# Patient Record
Sex: Female | Born: 1945 | Hispanic: No | Marital: Married | Smoking: Never smoker
Health system: Southern US, Community
[De-identification: ages and names within clinical notes are randomized; demographics above are authoritative.]

## PROBLEM LIST (undated history)

## (undated) DIAGNOSIS — I10 Essential (primary) hypertension: Secondary | ICD-10-CM

---

## 1998-05-31 ENCOUNTER — Other Ambulatory Visit: Admission: RE | Admit: 1998-05-31 | Discharge: 1998-05-31 | Payer: Self-pay | Admitting: Family Medicine

## 2003-01-09 ENCOUNTER — Other Ambulatory Visit: Admission: RE | Admit: 2003-01-09 | Discharge: 2003-01-09 | Payer: Self-pay | Admitting: Obstetrics and Gynecology

## 2004-04-03 ENCOUNTER — Other Ambulatory Visit: Admission: RE | Admit: 2004-04-03 | Discharge: 2004-04-03 | Payer: Self-pay | Admitting: Obstetrics and Gynecology

## 2014-01-01 ENCOUNTER — Encounter: Payer: Self-pay | Admitting: Gastroenterology

## 2018-08-03 ENCOUNTER — Encounter (HOSPITAL_COMMUNITY): Payer: Self-pay | Admitting: Emergency Medicine

## 2018-08-03 ENCOUNTER — Emergency Department (HOSPITAL_COMMUNITY): Payer: PRIVATE HEALTH INSURANCE

## 2018-08-03 ENCOUNTER — Emergency Department (HOSPITAL_COMMUNITY)
Admission: EM | Admit: 2018-08-03 | Discharge: 2018-08-03 | Disposition: A | Discharge status: Home / Self Care | Payer: PRIVATE HEALTH INSURANCE | Attending: Emergency Medicine | Admitting: Emergency Medicine

## 2018-08-03 ENCOUNTER — Other Ambulatory Visit: Payer: Self-pay

## 2018-08-03 DIAGNOSIS — I10 Essential (primary) hypertension: Secondary | ICD-10-CM | POA: Insufficient documentation

## 2018-08-03 DIAGNOSIS — W01198A Fall on same level from slipping, tripping and stumbling with subsequent striking against other object, initial encounter: Secondary | ICD-10-CM | POA: Insufficient documentation

## 2018-08-03 DIAGNOSIS — Y998 Other external cause status: Secondary | ICD-10-CM | POA: Insufficient documentation

## 2018-08-03 DIAGNOSIS — S40812A Abrasion of left upper arm, initial encounter: Secondary | ICD-10-CM | POA: Insufficient documentation

## 2018-08-03 DIAGNOSIS — Z23 Encounter for immunization: Secondary | ICD-10-CM | POA: Diagnosis not present

## 2018-08-03 DIAGNOSIS — Y9301 Activity, walking, marching and hiking: Secondary | ICD-10-CM | POA: Diagnosis not present

## 2018-08-03 DIAGNOSIS — S42201A Unspecified fracture of upper end of right humerus, initial encounter for closed fracture: Secondary | ICD-10-CM | POA: Insufficient documentation

## 2018-08-03 DIAGNOSIS — Y929 Unspecified place or not applicable: Secondary | ICD-10-CM | POA: Diagnosis not present

## 2018-08-03 DIAGNOSIS — S0083XA Contusion of other part of head, initial encounter: Secondary | ICD-10-CM | POA: Insufficient documentation

## 2018-08-03 DIAGNOSIS — S4991XA Unspecified injury of right shoulder and upper arm, initial encounter: Secondary | ICD-10-CM | POA: Diagnosis present

## 2018-08-03 DIAGNOSIS — W010XXA Fall on same level from slipping, tripping and stumbling without subsequent striking against object, initial encounter: Secondary | ICD-10-CM

## 2018-08-03 HISTORY — DX: Essential (primary) hypertension: I10

## 2018-08-03 MED ORDER — TRAMADOL HCL 50 MG PO TABS: 50.0000 mg | ORAL_TABLET | Freq: Four times a day (QID) | ORAL | 0 refills | Status: AC | PRN

## 2018-08-03 MED ORDER — ACETAMINOPHEN 500 MG PO TABS
1000.0000 mg | ORAL_TABLET | Freq: Once | ORAL | Status: DC
  Filled 2018-08-03: qty 2

## 2018-08-03 MED ORDER — TETANUS-DIPHTH-ACELL PERTUSSIS 5-2.5-18.5 LF-MCG/0.5 IM SUSP
0.5000 mL | Freq: Once | INTRAMUSCULAR | Status: AC
  Administered 2018-08-03: 0.5 mL via INTRAMUSCULAR
  Filled 2018-08-03: qty 0.5

## 2018-08-03 NOTE — ED Provider Notes (Signed)
[facility] EMERGENCY DEPARTMENT Provider Note   CSN: [id number] Arrival date & time: 08/03/18  2030     History   Chief Complaint Chief Complaint  Patient presents with  . Fall  . Shoulder Pain    HPI Amber Robles is a 73 y.o. female.  Patient c/o trip and fall today around 1700. States tripped on cardboard, fell forward. Hit head, no loc. C/o right shoulder pain. Pain constant, dull, moderate, non radiating, worse w movement. No arm numbness/weakness. +neck pain, no radicular pain. No back pain. Denies chest pain or sob. No abd pain. No nv. Abrasion to LUE, tetanus unknown. Ambulatory since fall. Denies other pain or injury.   The history is provided by the patient and a relative.  Fall  Pertinent negatives include no chest pain, no abdominal pain, no headaches and no shortness of breath.  Shoulder Pain  Associated symptoms: no back pain and no fever     Past Medical History:  Diagnosis Date  . Hypertension     There are no active problems to display for this patient.   History reviewed. No pertinent surgical history.   OB History   No obstetric history on file.      Home Medications    Prior to Admission medications   Not on File    Family History No family history on file.  Social History Social History   Tobacco Use  . Smoking status: Never Smoker  . Smokeless tobacco: Never Used  Substance Use Topics  . Alcohol use: Yes  . Drug use: Never     Allergies   Sulfa antibiotics   Review of Systems Review of Systems  Constitutional: Negative for fever.  HENT: Negative for nosebleeds.   Eyes: Negative for redness.  Respiratory: Negative for shortness of breath.   Cardiovascular: Negative for chest pain.  Gastrointestinal: Negative for abdominal pain and vomiting.  Genitourinary: Negative for flank pain.  Musculoskeletal: Negative for back pain.  Skin: Negative for rash.  Neurological: Negative for weakness, numbness and  headaches.  Hematological: Does not bruise/bleed easily.       No anticoag use.   Psychiatric/Behavioral: Negative for confusion.     Physical Exam Updated Vital Signs BP 125/81 (BP Location: Right Arm)   Pulse 88   Temp 98.5 F (36.9 C) (Oral)   Resp 18   SpO2 98%   Physical Exam Vitals signs and nursing note reviewed.  Constitutional:      Appearance: Normal appearance. She is well-developed.  HENT:     Head:     Comments: Minor contusion right forehead.     Nose: Nose normal.     Mouth/Throat:     Mouth: Mucous membranes are moist.  Eyes:     General: No scleral icterus.    Conjunctiva/sclera: Conjunctivae normal.     Pupils: Pupils are equal, round, and reactive to light.  Neck:     Musculoskeletal: Normal range of motion and neck supple. Muscular tenderness present. No neck rigidity.     Trachea: No tracheal deviation.  Cardiovascular:     Rate and Rhythm: Normal rate and regular rhythm.     Pulses: Normal pulses.     Heart sounds: Normal heart sounds. No murmur. No friction rub. No gallop.   Pulmonary:     Effort: Pulmonary effort is normal. No respiratory distress.     Breath sounds: Normal breath sounds.  Abdominal:     General: Bowel sounds are normal. There is no  distension.     Palpations: Abdomen is soft.     Tenderness: There is no abdominal tenderness. There is no guarding.  Genitourinary:    Comments: No cva tenderness.  Musculoskeletal:     Comments: Tenderness right shoulder. Compartments of arm soft, not tense. Radial pulse 2+. Mid to lower c spine tenderness, otherwise, CTLS spine, non tender, aligned, no step off. Good rom bil extremities, no other focal bony tenderness noted.   Skin:    General: Skin is warm and dry.     Findings: No rash.  Neurological:     Mental Status: She is alert.     Comments: Alert, speech normal. Motor/sens grossly intact bil. RUE, rad/med/uln n fxn intact, motor and sens.   Psychiatric:        Mood and Affect:  Mood normal.      ED Treatments / Results  Labs (all labs ordered are listed, but only abnormal results are displayed) Labs Reviewed - No data to display  EKG None  Radiology Dg Shoulder Right  Result Date: 08/03/2018 CLINICAL DATA:  Right shoulder pain after fall. EXAM: RIGHT SHOULDER - 2+ VIEW COMPARISON:  None. FINDINGS: Moderately displaced and comminuted fracture is seen involving the proximal right humeral head and neck. Visualized ribs are unremarkable. Scapula is unremarkable. IMPRESSION: Moderately displaced and comminuted proximal right humeral head and neck fracture. Electronically Signed   By: Lupita RaiderJames  Green Jr, M.D.   On: 08/03/2018 21:13   Ct Cervical Spine Wo Contrast  Result Date: 08/03/2018 CLINICAL DATA:  Fall with right shoulder pain and forehead bruising. EXAM: CT CERVICAL SPINE WITHOUT CONTRAST TECHNIQUE: Multidetector CT imaging of the cervical spine was performed without intravenous contrast. Multiplanar CT image reconstructions were also generated. COMPARISON:  None. FINDINGS: Alignment: No static subluxation. Facets are aligned. Occipital condyles and the lateral masses of C1 and C2 are normally approximated. Skull base and vertebrae: No acute fracture. Soft tissues and spinal canal: No prevertebral fluid or swelling. No visible canal hematoma. Disc levels: No advanced spinal canal or neural foraminal stenosis. Left C3-4 facets are fused. Mild multilevel lower cervical degenerative disc disease. Upper chest: No pneumothorax, pulmonary nodule or pleural effusion. Other: Normal visualized paraspinal cervical soft tissues. IMPRESSION: No acute fracture or static subluxation of the cervical spine. Electronically Signed   By: Deatra RobinsonKevin  Herman M.D.   On: 08/03/2018 22:58    Procedures Procedures (including critical care time)  Medications Ordered in ED Medications  acetaminophen (TYLENOL) tablet 1,000 mg (has no administration in time range)  Tdap (BOOSTRIX) injection 0.5  mL (has no administration in time range)     Initial Impression / Assessment and Plan / ED Course  I have reviewed the triage vital signs and the nursing notes.  Pertinent labs & imaging results that were available during my care of the patient were reviewed by me and considered in my medical decision making (see chart for details).  Imaging ordered.  xrays reviewed - comminuted prox hum fx. Shoulder imm placed. Icepack.   Pt indicates already took ibuprofen. Pt indicates does not want narcotic here for pain, will accept mild pain med. Acetaminophen po.   Reviewed nursing notes and prior charts for additional history.   Ct reviewed - neg acute.  Pt appears stable for d/c.  Ortho f/u.   Return precautions provided.    Final Clinical Impressions(s) / ED Diagnoses   Final diagnoses:  None    ED Discharge Orders    None  Cathren Laine, MD 08/03/18 [phone]

## 2018-08-03 NOTE — ED Triage Notes (Signed)
Pt sent by urgent care in [place], had a mechanical fall at 1700 today. C/o right shoulder pain and bruising to forehead. Denies LOC, no blood thinner use. Pt A&O x 4, ambulatory without assistance. Pt states she doesn't think she needs a CT scan of her head and will wait until she sees a provider.

## 2018-08-03 NOTE — ED Notes (Signed)
Pt from urgent care w/ a broken "arm".

## 2018-08-03 NOTE — Discharge Instructions (Addendum)
It was our pleasure to provide your ER care today - we hope that you feel better.  Take acetaminophen and/or ibuprofen as need for pain. You may also take ultram as need for pain - no driving when taking.  Follow up with orthopedist in the coming week - call office tomorrow to arrange appointment.   Wear shoulder immobilizer/sling, icepack to sore area.   Return to ER if worse, new symptoms, new or severe pain, severe headache, other concern.

## 2018-08-11 DIAGNOSIS — Z01818 Encounter for other preprocedural examination: Secondary | ICD-10-CM | POA: Diagnosis not present

## 2019-02-23 DIAGNOSIS — S42213A Unspecified displaced fracture of surgical neck of unspecified humerus, initial encounter for closed fracture: Secondary | ICD-10-CM | POA: Diagnosis not present

## 2019-02-23 DIAGNOSIS — S42211D Unspecified displaced fracture of surgical neck of right humerus, subsequent encounter for fracture with routine healing: Secondary | ICD-10-CM | POA: Diagnosis not present

## 2019-02-23 DIAGNOSIS — Z9889 Other specified postprocedural states: Secondary | ICD-10-CM | POA: Diagnosis not present

## 2019-03-08 ENCOUNTER — Encounter: Payer: Self-pay | Admitting: Gastroenterology

## 2019-06-14 ENCOUNTER — Other Ambulatory Visit: Payer: Self-pay

## 2020-02-13 ENCOUNTER — Other Ambulatory Visit: Payer: Self-pay

## 2020-11-06 IMAGING — CT CT CERVICAL SPINE W/O CM
3 of 4 series · 13 of 33 positions shown, 16 images · non-contrast
Comparison: None.

CLINICAL DATA: Fall with right shoulder pain and forehead bruising.

EXAM:
CT CERVICAL SPINE WITHOUT CONTRAST
TECHNIQUE: Multidetector CT imaging of the cervical spine was performed without
intravenous contrast. Multiplanar CT image reconstructions were also
generated.

[Series 5: c_spine 2.0 st · axial · 0.35mm/px · z∈[-303,-187]mm · 5 of 88 slices shown, 7 images]
[im 15/88  soft-tissue]
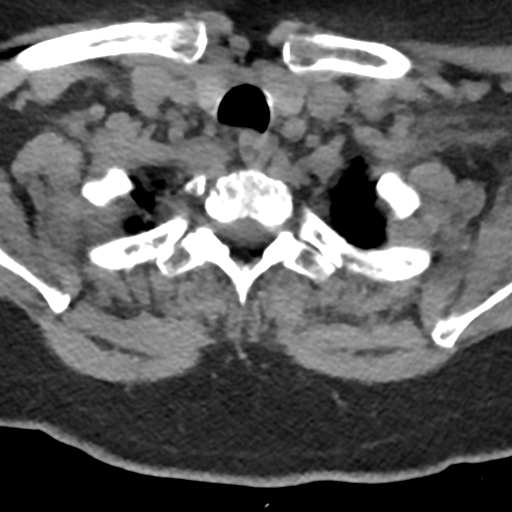
[im 15/88  bone]
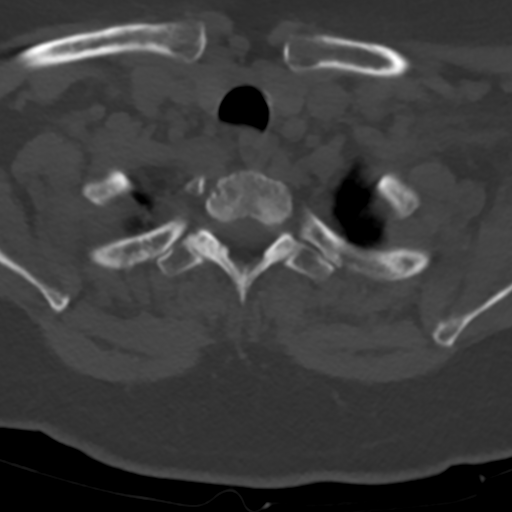
[im 30/88  bone]
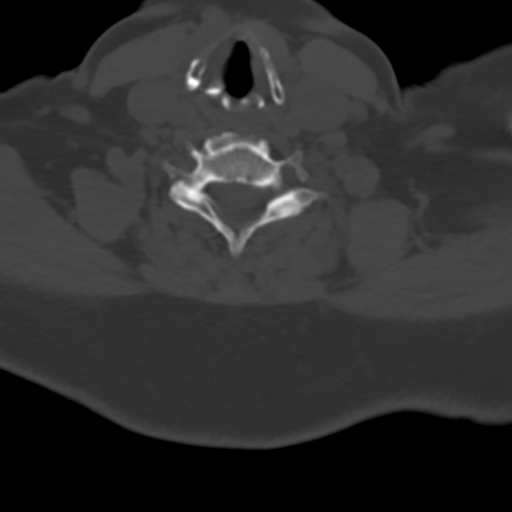
[im 44/88  bone]
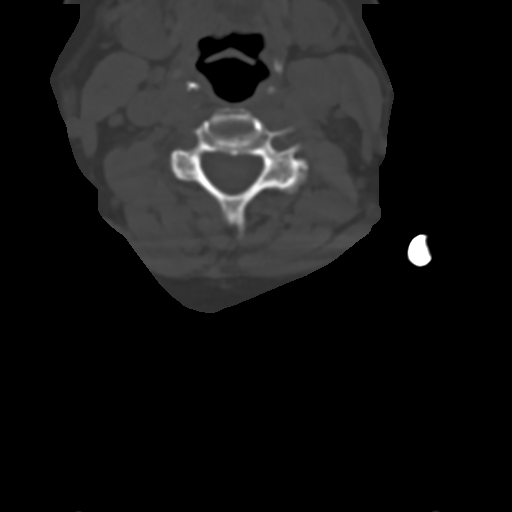
[im 59/88  bone]
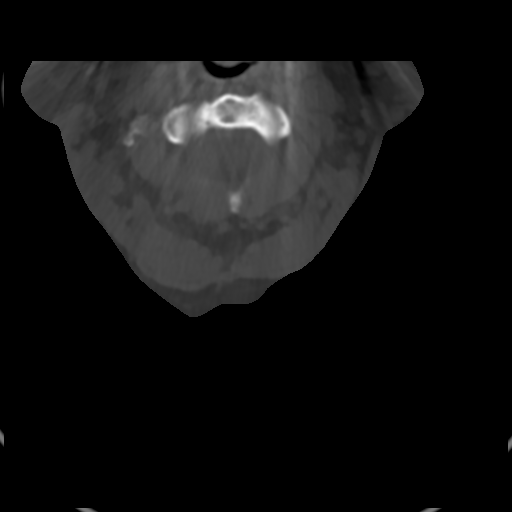
[im 73/88  soft-tissue]
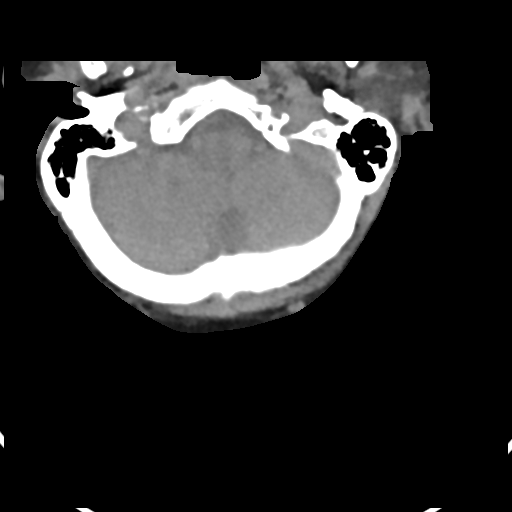
[im 73/88  bone]
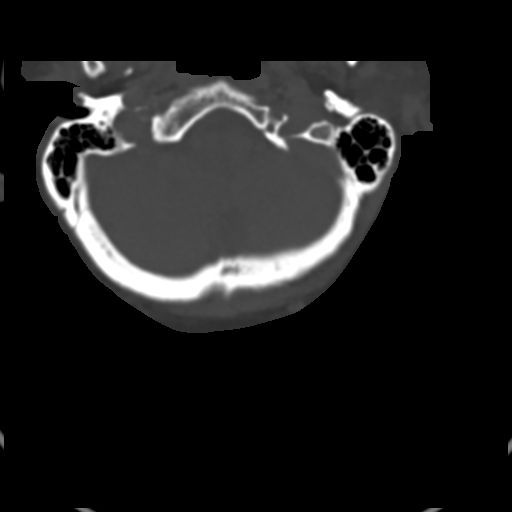

[Series 6: coronal bone · coronal · 0.26mm/px · 3 of 78 slices shown]
[im 16/78  bone]
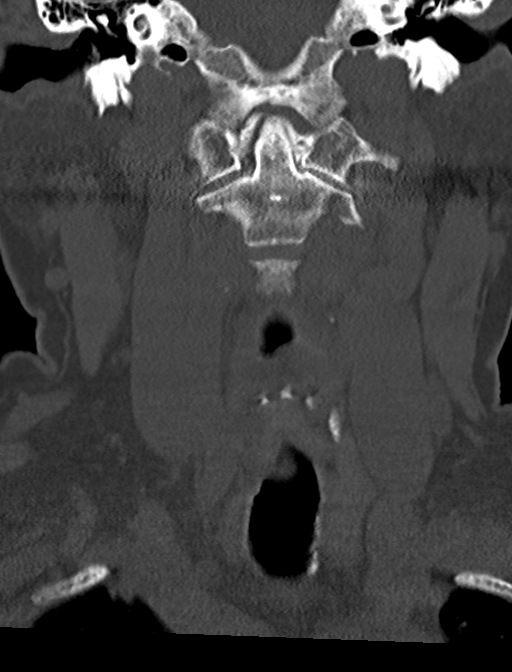
[im 31/78  bone]
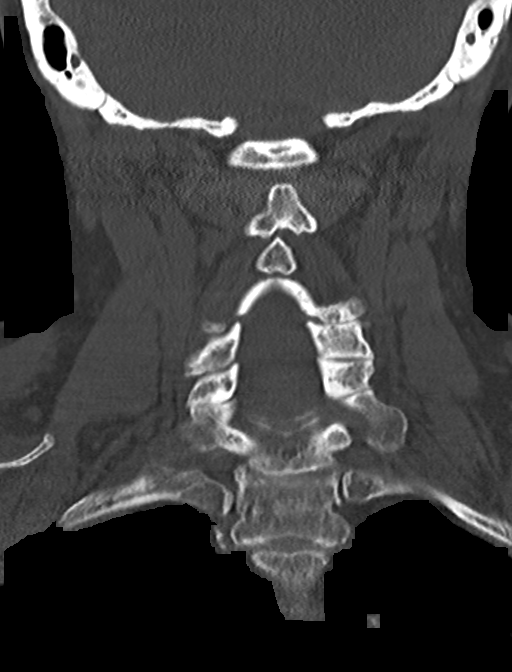
[im 47/78  bone]
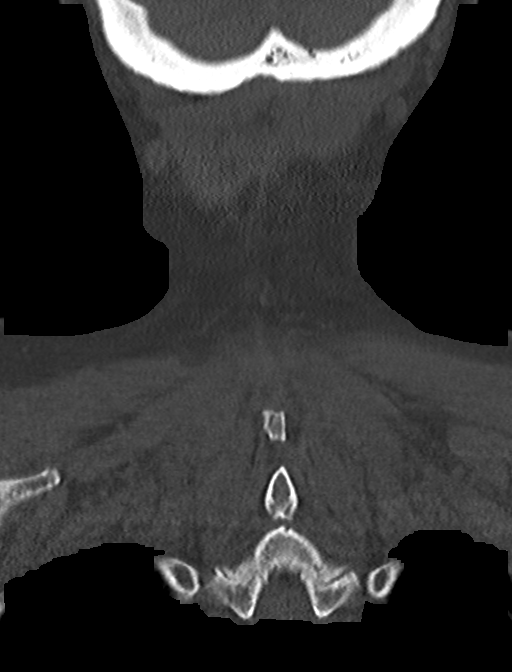

[Series 7: sagittal bone · sagittal · 0.31mm/px · 5 of 79 slices shown, 6 images]
[im 27/79  bone]
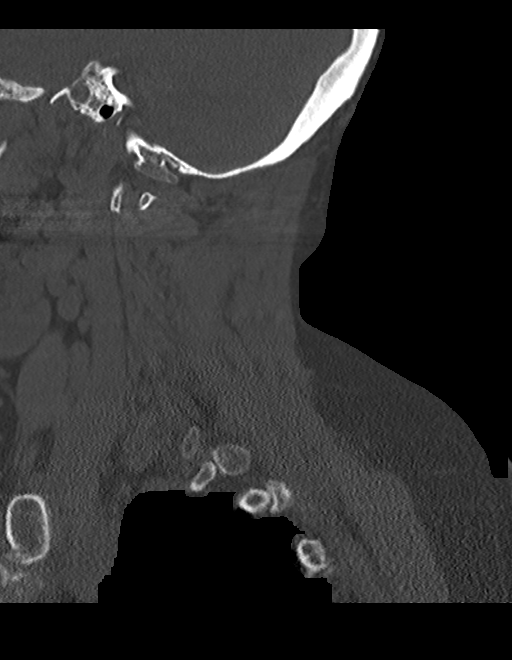
[im 33/79  bone]
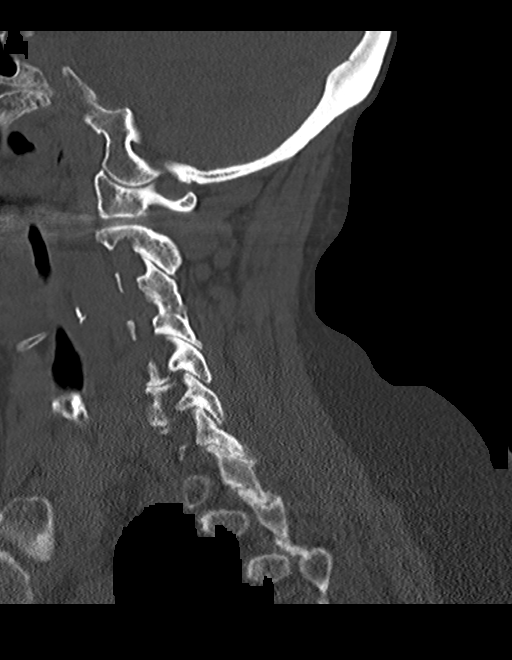
[im 40/79  soft-tissue]
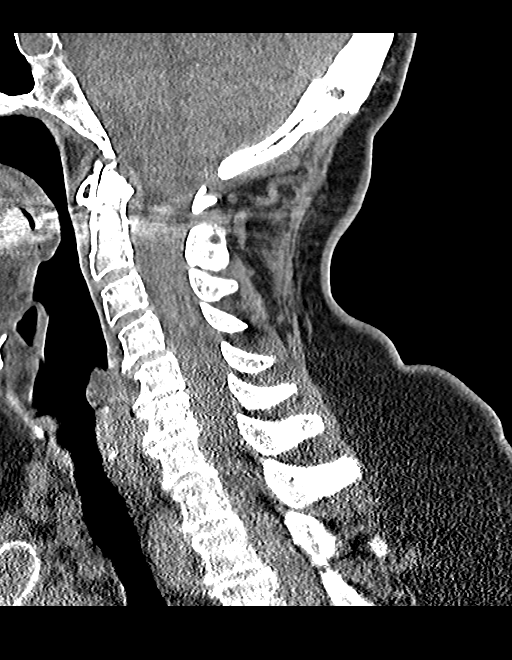
[im 40/79  bone]
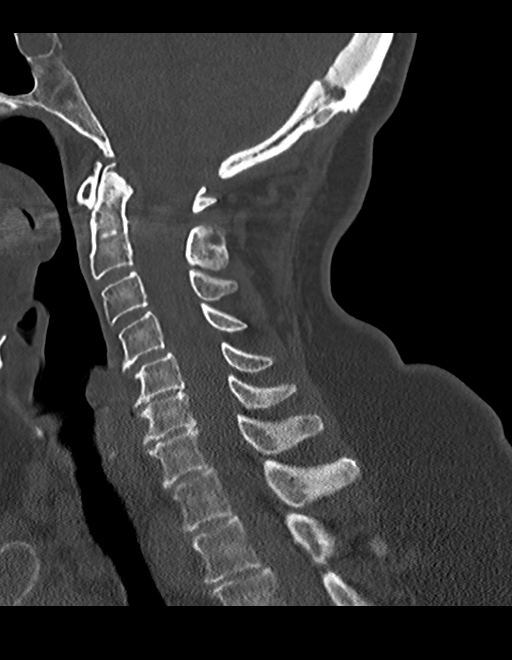
[im 46/79  bone]
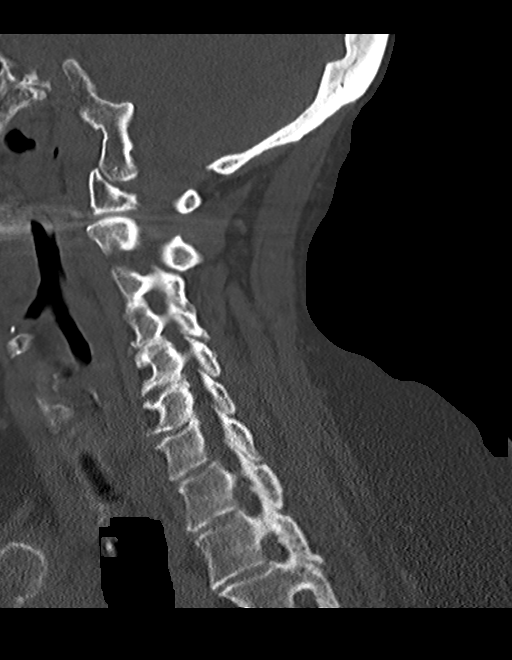
[im 53/79  bone]
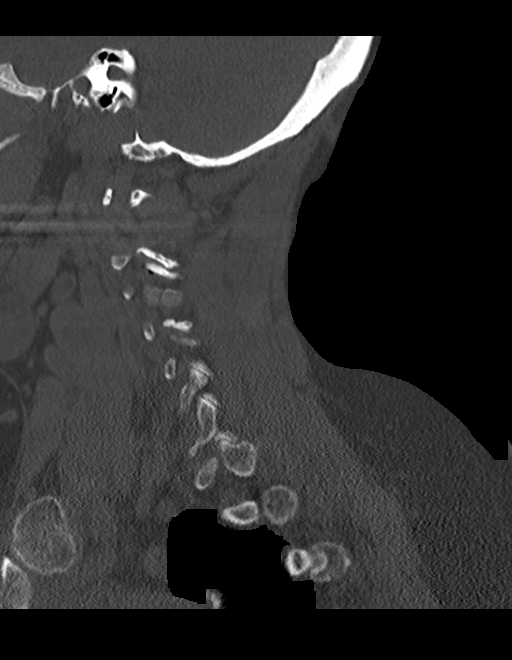

[13 of 33 positions shown; findings below may reference images not displayed]

FINDINGS: Alignment: No static subluxation. Facets are aligned. Occipital
condyles and the lateral masses of C1 and C2 are normally
approximated.

Skull base and vertebrae: No acute fracture.

Soft tissues and spinal canal: No prevertebral fluid or swelling. No
visible canal hematoma.

Disc levels: No advanced spinal canal or neural foraminal stenosis.
Left C3-4 facets are fused. Mild multilevel lower cervical
degenerative disc disease.

Upper chest: No pneumothorax, pulmonary nodule or pleural effusion.

Other: Normal visualized paraspinal cervical soft tissues.
IMPRESSION: No acute fracture or static subluxation of the cervical spine.

## 2021-08-11 DIAGNOSIS — E559 Vitamin D deficiency, unspecified: Secondary | ICD-10-CM | POA: Diagnosis not present

## 2021-08-11 DIAGNOSIS — H409 Unspecified glaucoma: Secondary | ICD-10-CM | POA: Diagnosis not present

## 2021-08-11 DIAGNOSIS — M1990 Unspecified osteoarthritis, unspecified site: Secondary | ICD-10-CM | POA: Diagnosis not present

## 2021-08-11 DIAGNOSIS — G620 Drug-induced polyneuropathy: Secondary | ICD-10-CM | POA: Diagnosis not present

## 2021-08-11 DIAGNOSIS — G8929 Other chronic pain: Secondary | ICD-10-CM | POA: Diagnosis not present

## 2021-08-11 DIAGNOSIS — L409 Psoriasis, unspecified: Secondary | ICD-10-CM | POA: Diagnosis not present

## 2021-08-11 DIAGNOSIS — E669 Obesity, unspecified: Secondary | ICD-10-CM | POA: Diagnosis not present

## 2021-08-11 DIAGNOSIS — H259 Unspecified age-related cataract: Secondary | ICD-10-CM | POA: Diagnosis not present

## 2021-08-11 DIAGNOSIS — R32 Unspecified urinary incontinence: Secondary | ICD-10-CM | POA: Diagnosis not present

## 2021-08-11 DIAGNOSIS — G4733 Obstructive sleep apnea (adult) (pediatric): Secondary | ICD-10-CM | POA: Diagnosis not present

## 2021-08-11 DIAGNOSIS — R011 Cardiac murmur, unspecified: Secondary | ICD-10-CM | POA: Diagnosis not present

## 2021-08-11 DIAGNOSIS — M8580 Other specified disorders of bone density and structure, unspecified site: Secondary | ICD-10-CM | POA: Diagnosis not present

## 2021-09-17 DIAGNOSIS — Z1231 Encounter for screening mammogram for malignant neoplasm of breast: Secondary | ICD-10-CM | POA: Diagnosis not present

## 2021-09-26 DIAGNOSIS — R922 Inconclusive mammogram: Secondary | ICD-10-CM | POA: Diagnosis not present

## 2021-09-26 DIAGNOSIS — R921 Mammographic calcification found on diagnostic imaging of breast: Secondary | ICD-10-CM | POA: Diagnosis not present

## 2021-09-26 DIAGNOSIS — R928 Other abnormal and inconclusive findings on diagnostic imaging of breast: Secondary | ICD-10-CM | POA: Diagnosis not present

## 2021-09-30 DIAGNOSIS — R921 Mammographic calcification found on diagnostic imaging of breast: Secondary | ICD-10-CM | POA: Diagnosis not present

## 2021-09-30 DIAGNOSIS — N6011 Diffuse cystic mastopathy of right breast: Secondary | ICD-10-CM | POA: Diagnosis not present

## 2021-11-03 DIAGNOSIS — H35342 Macular cyst, hole, or pseudohole, left eye: Secondary | ICD-10-CM | POA: Diagnosis not present

## 2021-11-03 DIAGNOSIS — H35352 Cystoid macular degeneration, left eye: Secondary | ICD-10-CM | POA: Diagnosis not present

## 2021-11-03 DIAGNOSIS — H52223 Regular astigmatism, bilateral: Secondary | ICD-10-CM | POA: Diagnosis not present

## 2021-11-03 DIAGNOSIS — H18513 Endothelial corneal dystrophy, bilateral: Secondary | ICD-10-CM | POA: Diagnosis not present

## 2021-11-03 DIAGNOSIS — H401132 Primary open-angle glaucoma, bilateral, moderate stage: Secondary | ICD-10-CM | POA: Diagnosis not present

## 2021-11-03 DIAGNOSIS — H5203 Hypermetropia, bilateral: Secondary | ICD-10-CM | POA: Diagnosis not present

## 2021-11-03 DIAGNOSIS — H524 Presbyopia: Secondary | ICD-10-CM | POA: Diagnosis not present

## 2021-11-03 DIAGNOSIS — H25813 Combined forms of age-related cataract, bilateral: Secondary | ICD-10-CM | POA: Diagnosis not present

## 2021-11-03 DIAGNOSIS — H47233 Glaucomatous optic atrophy, bilateral: Secondary | ICD-10-CM | POA: Diagnosis not present

## 2022-11-05 DIAGNOSIS — H5203 Hypermetropia, bilateral: Secondary | ICD-10-CM | POA: Diagnosis not present

## 2022-11-05 DIAGNOSIS — H52223 Regular astigmatism, bilateral: Secondary | ICD-10-CM | POA: Diagnosis not present

## 2022-11-05 DIAGNOSIS — H25813 Combined forms of age-related cataract, bilateral: Secondary | ICD-10-CM | POA: Diagnosis not present

## 2022-11-05 DIAGNOSIS — H35352 Cystoid macular degeneration, left eye: Secondary | ICD-10-CM | POA: Diagnosis not present

## 2022-11-05 DIAGNOSIS — H47233 Glaucomatous optic atrophy, bilateral: Secondary | ICD-10-CM | POA: Diagnosis not present

## 2022-11-05 DIAGNOSIS — H524 Presbyopia: Secondary | ICD-10-CM | POA: Diagnosis not present

## 2022-11-05 DIAGNOSIS — H35342 Macular cyst, hole, or pseudohole, left eye: Secondary | ICD-10-CM | POA: Diagnosis not present

## 2022-11-05 DIAGNOSIS — H18513 Endothelial corneal dystrophy, bilateral: Secondary | ICD-10-CM | POA: Diagnosis not present

## 2022-11-05 DIAGNOSIS — H401132 Primary open-angle glaucoma, bilateral, moderate stage: Secondary | ICD-10-CM | POA: Diagnosis not present

## 2023-03-25 DIAGNOSIS — R509 Fever, unspecified: Secondary | ICD-10-CM | POA: Diagnosis not present

## 2023-03-25 DIAGNOSIS — R5382 Chronic fatigue, unspecified: Secondary | ICD-10-CM | POA: Diagnosis not present

## 2024-05-22 DIAGNOSIS — H524 Presbyopia: Secondary | ICD-10-CM | POA: Diagnosis not present

## 2024-05-22 DIAGNOSIS — H401132 Primary open-angle glaucoma, bilateral, moderate stage: Secondary | ICD-10-CM | POA: Diagnosis not present

## 2024-05-22 DIAGNOSIS — H35352 Cystoid macular degeneration, left eye: Secondary | ICD-10-CM | POA: Diagnosis not present

## 2024-05-22 DIAGNOSIS — H25813 Combined forms of age-related cataract, bilateral: Secondary | ICD-10-CM | POA: Diagnosis not present

## 2024-05-22 DIAGNOSIS — H47233 Glaucomatous optic atrophy, bilateral: Secondary | ICD-10-CM | POA: Diagnosis not present

## 2024-05-22 DIAGNOSIS — H35342 Macular cyst, hole, or pseudohole, left eye: Secondary | ICD-10-CM | POA: Diagnosis not present

## 2024-05-22 DIAGNOSIS — H52223 Regular astigmatism, bilateral: Secondary | ICD-10-CM | POA: Diagnosis not present

## 2024-05-22 DIAGNOSIS — H40003 Preglaucoma, unspecified, bilateral: Secondary | ICD-10-CM | POA: Diagnosis not present

## 2024-05-22 DIAGNOSIS — H5203 Hypermetropia, bilateral: Secondary | ICD-10-CM | POA: Diagnosis not present

## 2024-06-02 DIAGNOSIS — H524 Presbyopia: Secondary | ICD-10-CM | POA: Diagnosis not present

## 2024-06-02 DIAGNOSIS — H5203 Hypermetropia, bilateral: Secondary | ICD-10-CM | POA: Diagnosis not present

## 2024-06-02 DIAGNOSIS — H401132 Primary open-angle glaucoma, bilateral, moderate stage: Secondary | ICD-10-CM | POA: Diagnosis not present
